# Patient Record
Sex: Female | Born: 2005 | Race: White | Hispanic: No | Marital: Single | Smoking: Never smoker
Health system: Southern US, Community
[De-identification: ages and names within clinical notes are randomized; demographics above are authoritative.]

## PROBLEM LIST (undated history)

## (undated) DIAGNOSIS — K219 Gastro-esophageal reflux disease without esophagitis: Secondary | ICD-10-CM

## (undated) HISTORY — DX: Gastro-esophageal reflux disease without esophagitis: K21.9

---
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
## 2024-03-14 DIAGNOSIS — L700 Acne vulgaris: Secondary | ICD-10-CM | POA: Diagnosis not present

## 2024-03-14 DIAGNOSIS — L905 Scar conditions and fibrosis of skin: Secondary | ICD-10-CM | POA: Diagnosis not present

## 2024-04-14 DIAGNOSIS — L700 Acne vulgaris: Secondary | ICD-10-CM | POA: Diagnosis not present

## 2024-04-14 DIAGNOSIS — L905 Scar conditions and fibrosis of skin: Secondary | ICD-10-CM | POA: Diagnosis not present

## 2024-04-28 DIAGNOSIS — K011 Impacted teeth: Secondary | ICD-10-CM | POA: Diagnosis not present

## 2024-07-19 DIAGNOSIS — Z23 Encounter for immunization: Secondary | ICD-10-CM | POA: Diagnosis not present

## 2024-07-19 DIAGNOSIS — Z713 Dietary counseling and surveillance: Secondary | ICD-10-CM | POA: Diagnosis not present

## 2024-07-19 DIAGNOSIS — Z0000 Encounter for general adult medical examination without abnormal findings: Secondary | ICD-10-CM | POA: Diagnosis not present

## 2024-07-19 DIAGNOSIS — Z3041 Encounter for surveillance of contraceptive pills: Secondary | ICD-10-CM | POA: Diagnosis not present

## 2024-07-19 DIAGNOSIS — L709 Acne, unspecified: Secondary | ICD-10-CM | POA: Diagnosis not present

## 2024-07-19 DIAGNOSIS — F419 Anxiety disorder, unspecified: Secondary | ICD-10-CM | POA: Diagnosis not present

## 2024-09-21 ENCOUNTER — Other Ambulatory Visit (HOSPITAL_BASED_OUTPATIENT_CLINIC_OR_DEPARTMENT_OTHER): Payer: Self-pay

## 2024-09-21 MED ORDER — HEPLISAV-B 20 MCG/0.5ML IM SOSY
0.5000 mL | PREFILLED_SYRINGE | Freq: Once | INTRAMUSCULAR | 0 refills | Status: AC
  Filled 2024-09-21: qty 0.5, 1d supply, fill #0
